# Patient Record
Sex: Male | Born: 1995 | Race: Asian | Hispanic: No | Marital: Single | Smoking: Never smoker
Health system: Southern US, Community
[De-identification: ages and names within clinical notes are randomized; demographics above are authoritative.]

## PROBLEM LIST (undated history)

## (undated) DIAGNOSIS — U071 COVID-19: Secondary | ICD-10-CM

---

## 2019-08-12 ENCOUNTER — Emergency Department (HOSPITAL_COMMUNITY): Payer: Managed Care, Other (non HMO)

## 2019-08-12 ENCOUNTER — Encounter (HOSPITAL_COMMUNITY): Payer: Self-pay

## 2019-08-12 ENCOUNTER — Inpatient Hospital Stay (HOSPITAL_COMMUNITY)
Admission: EM | Admit: 2019-08-12 | Discharge: 2019-08-14 | DRG: 084 | Disposition: A | Discharge status: Home / Self Care | Payer: Managed Care, Other (non HMO) | Attending: Internal Medicine | Admitting: Internal Medicine

## 2019-08-12 ENCOUNTER — Other Ambulatory Visit: Payer: Self-pay

## 2019-08-12 DIAGNOSIS — E876 Hypokalemia: Secondary | ICD-10-CM | POA: Diagnosis present

## 2019-08-12 DIAGNOSIS — S066X9A Traumatic subarachnoid hemorrhage with loss of consciousness of unspecified duration, initial encounter: Principal | ICD-10-CM | POA: Diagnosis present

## 2019-08-12 DIAGNOSIS — R402143 Coma scale, eyes open, spontaneous, at hospital admission: Secondary | ICD-10-CM | POA: Diagnosis present

## 2019-08-12 DIAGNOSIS — R402253 Coma scale, best verbal response, oriented, at hospital admission: Secondary | ICD-10-CM | POA: Diagnosis present

## 2019-08-12 DIAGNOSIS — I629 Nontraumatic intracranial hemorrhage, unspecified: Secondary | ICD-10-CM

## 2019-08-12 DIAGNOSIS — S060XAA Concussion with loss of consciousness status unknown, initial encounter: Secondary | ICD-10-CM

## 2019-08-12 DIAGNOSIS — Z8616 Personal history of COVID-19: Secondary | ICD-10-CM

## 2019-08-12 DIAGNOSIS — R402363 Coma scale, best motor response, obeys commands, at hospital admission: Secondary | ICD-10-CM | POA: Diagnosis present

## 2019-08-12 DIAGNOSIS — S0990XA Unspecified injury of head, initial encounter: Secondary | ICD-10-CM

## 2019-08-12 DIAGNOSIS — R569 Unspecified convulsions: Secondary | ICD-10-CM | POA: Diagnosis present

## 2019-08-12 DIAGNOSIS — S0003XD Contusion of scalp, subsequent encounter: Secondary | ICD-10-CM | POA: Diagnosis not present

## 2019-08-12 DIAGNOSIS — W19XXXD Unspecified fall, subsequent encounter: Secondary | ICD-10-CM | POA: Diagnosis not present

## 2019-08-12 DIAGNOSIS — S0003XA Contusion of scalp, initial encounter: Secondary | ICD-10-CM | POA: Diagnosis present

## 2019-08-12 DIAGNOSIS — Y92008 Other place in unspecified non-institutional (private) residence as the place of occurrence of the external cause: Secondary | ICD-10-CM

## 2019-08-12 DIAGNOSIS — W19XXXA Unspecified fall, initial encounter: Secondary | ICD-10-CM | POA: Diagnosis not present

## 2019-08-12 DIAGNOSIS — R55 Syncope and collapse: Secondary | ICD-10-CM | POA: Diagnosis present

## 2019-08-12 DIAGNOSIS — S060X9A Concussion with loss of consciousness of unspecified duration, initial encounter: Secondary | ICD-10-CM

## 2019-08-12 HISTORY — DX: COVID-19: U07.1

## 2019-08-12 LAB — COMPREHENSIVE METABOLIC PANEL
ALT: 21 U/L (ref 0–44)
AST: 21 U/L (ref 15–41)
Albumin: 4.5 g/dL (ref 3.5–5.0)
Alkaline Phosphatase: 43 U/L (ref 38–126)
Anion gap: 14 (ref 5–15)
BUN: 16 mg/dL (ref 6–20)
CO2: 22 mmol/L (ref 22–32)
Calcium: 9.6 mg/dL (ref 8.9–10.3)
Chloride: 102 mmol/L (ref 98–111)
Creatinine, Ser: 0.84 mg/dL (ref 0.61–1.24)
GFR calc Af Amer: >60 mL/min (ref >60)
GFR calc non Af Amer: >60 mL/min (ref >60)
Glucose, Bld: 122 mg/dL — ABNORMAL HIGH (ref 70–99)
Potassium: 3.7 mmol/L (ref 3.5–5.1)
Sodium: 138 mmol/L (ref 135–145)
Total Bilirubin: 0.8 mg/dL (ref 0.3–1.2)
Total Protein: 6.6 g/dL (ref 6.5–8.1)

## 2019-08-12 LAB — CBC WITH DIFFERENTIAL/PLATELET
Abs Immature Granulocytes: 0.06 10*3/uL (ref 0.00–0.07)
Basophils Absolute: 0 10*3/uL (ref 0.0–0.1)
Basophils Relative: 0 %
Eosinophils Absolute: 0.1 10*3/uL (ref 0.0–0.5)
Eosinophils Relative: 1 %
HCT: 46.2 % (ref 39.0–52.0)
Hemoglobin: 16.2 g/dL (ref 13.0–17.0)
Immature Granulocytes: 1 %
Lymphocytes Relative: 18 %
Lymphs Abs: 1.7 10*3/uL (ref 0.7–4.0)
MCH: 32.5 pg (ref 26.0–34.0)
MCHC: 35.1 g/dL (ref 30.0–36.0)
MCV: 92.6 fL (ref 80.0–100.0)
Monocytes Absolute: 0.4 10*3/uL (ref 0.1–1.0)
Monocytes Relative: 4 %
Neutro Abs: 6.8 10*3/uL (ref 1.7–7.7)
Neutrophils Relative %: 76 %
Platelets: 197 10*3/uL (ref 150–400)
RBC: 4.99 MIL/uL (ref 4.22–5.81)
RDW: 11.6 % (ref 11.5–15.5)
WBC: 9 10*3/uL (ref 4.0–10.5)
nRBC: 0 % (ref 0.0–0.2)

## 2019-08-12 LAB — URINALYSIS, ROUTINE W REFLEX MICROSCOPIC
Bilirubin Urine: NEGATIVE
Glucose, UA: NEGATIVE mg/dL
Hgb urine dipstick: NEGATIVE
Ketones, ur: 5 mg/dL — AB
Leukocytes,Ua: NEGATIVE
Nitrite: NEGATIVE
Protein, ur: NEGATIVE mg/dL
Specific Gravity, Urine: 1.012 (ref 1.005–1.030)
pH: 7 (ref 5.0–8.0)

## 2019-08-12 LAB — CBG MONITORING, ED: Glucose-Capillary: 118 mg/dL — ABNORMAL HIGH (ref 70–99)

## 2019-08-12 LAB — RAPID URINE DRUG SCREEN, HOSP PERFORMED
Amphetamines: NOT DETECTED
Barbiturates: NOT DETECTED
Benzodiazepines: NOT DETECTED
Cocaine: NOT DETECTED
Opiates: NOT DETECTED
Tetrahydrocannabinol: NOT DETECTED

## 2019-08-12 LAB — TROPONIN I (HIGH SENSITIVITY)
Troponin I (High Sensitivity): <2 ng/L (ref <18)
Troponin I (High Sensitivity): 4 ng/L (ref <18)

## 2019-08-12 LAB — ETHANOL: Alcohol, Ethyl (B): <10 mg/dL (ref <10)

## 2019-08-12 MED ORDER — ACETAMINOPHEN 650 MG RE SUPP: 650.0000 mg | Freq: Four times a day (QID) | RECTAL | Status: DC | PRN

## 2019-08-12 MED ORDER — SODIUM CHLORIDE 0.9 % IV SOLN: INTRAVENOUS | Status: DC

## 2019-08-12 MED ORDER — ACETAMINOPHEN 325 MG PO TABS
650.0000 mg | ORAL_TABLET | Freq: Four times a day (QID) | ORAL | Status: DC | PRN
  Filled 2019-08-12: qty 2

## 2019-08-12 MED ORDER — ONDANSETRON HCL 4 MG PO TABS: 4.0000 mg | ORAL_TABLET | Freq: Four times a day (QID) | ORAL | Status: DC | PRN

## 2019-08-12 MED ORDER — ACETAMINOPHEN 325 MG PO TABS
650.0000 mg | ORAL_TABLET | Freq: Once | ORAL | Status: AC
  Administered 2019-08-12: 17:00:00 650 mg via ORAL
  Filled 2019-08-12: qty 2

## 2019-08-12 MED ORDER — SODIUM CHLORIDE 0.9 % IV BOLUS
1000.0000 mL | Freq: Once | INTRAVENOUS | Status: AC
  Administered 2019-08-12: 1000 mL via INTRAVENOUS

## 2019-08-12 MED ORDER — ONDANSETRON HCL 4 MG/2ML IJ SOLN: 4.0000 mg | Freq: Four times a day (QID) | INTRAMUSCULAR | Status: DC | PRN

## 2019-08-12 MED ORDER — ONDANSETRON HCL 4 MG/2ML IJ SOLN: 4.0000 mg | Freq: Once | INTRAMUSCULAR | Status: DC

## 2019-08-12 NOTE — ED Notes (Signed)
Pt vomited in room. Pt A&Ox4 at this time.

## 2019-08-12 NOTE — ED Notes (Signed)
Patient transported to CT 

## 2019-08-12 NOTE — ED Notes (Signed)
Pt felt he could not urinate and requested more PO fluids to urinate.

## 2019-08-12 NOTE — H&P (Signed)
History and body  Arthur Flores IRS:[id number] DOB: 29-May-1996 DOA: 08/12/2019  Referring MD/NP/PA: Dr. Darl Householder  PCP: Patient, No Pcp Per   Outpatient Specialists: None  Patient coming from: Home  Chief Complaint: Passing out  HPI: Arthur Flores is a 24 y.o. 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  Patient has no recollection of what happened and how long has been down.  Family found him and brought him to the ER.  He was found to have significant hematoma on his skull and CT findings of possible head bleed.  Patient is currently back to baseline.  He reported occasional dizziness during his exercises in the past.  He is always told that was normal.  He has had palpitations during exercise which he also thought was normal.  Patient uses some herbal supplements which are plant-based but no steroids.  Denies using any drugs.  Patient has had previous COVID-19 infection about 2 months ago so outside the window for infectiousness..  ED Course: Temperature 98.5 blood pressure 140/88 pulse 80 respiratory rate of 22 oxygen sat 95% room air.  CBC and chemistry all appears to be within normal except potassium 3.4.  Urine drug screen is negative chest x-ray shows no acute finding.  CT head showed a 3 mm hyperdense focus in the right parietal lobe.  Also right parietal scalp hematoma.  Suspected punctate focus of subarachnoid blood versus small contusion.  Cervical spine is straight and otherwise no acute findings  Review of Systems: As per HPI otherwise 10 point review of systems negative.    Past Medical History:  Diagnosis Date  . COVID-19 virus infection     History reviewed. No pertinent surgical history.   reports that he has never smoked. He has never used smokeless tobacco. He reports that he does not  drink alcohol or use drugs.  Allergies  Allergen Reactions  . Dairy Aid [Lactase] Other (See Comments)    Upset stomach    History reviewed. No pertinent family history.   Prior to Admission medications   Not on File    Physical Exam: Vitals:   08/12/19 1830 08/12/19 1845 08/12/19 1900 08/12/19 1915  BP: 118/68 117/66 115/67 133/85  Pulse: 71 69 72 76  Resp: 18 15 (!) 22 (!) 21  Temp:      TempSrc:      SpO2: 100% 98% 98% 99%  Weight:      Height:          Constitutional: NAD, anxious, right parietal area hematoma of the skull Vitals:   08/12/19 1830 08/12/19 1845 08/12/19 1900 08/12/19 1915  BP: 118/68 117/66 115/67 133/85  Pulse: 71 69 72 76  Resp: 18 15 (!) 22 (!) 21  Temp:      TempSrc:      SpO2: 100% 98% 98% 99%  Weight:      Height:       Eyes: PERRL, lids and conjunctivae normal ENMT: Mucous membranes are moist. Posterior pharynx clear of any exudate or lesions.Normal dentition.  Neck: normal, supple, no masses, no thyromegaly Respiratory: clear to auscultation bilaterally, no wheezing, no crackles. Normal respiratory effort. No accessory muscle use.  Cardiovascular: Regular rate and rhythm, no murmurs / rubs / gallops. No extremity edema. 2+ pedal pulses. No carotid bruits.  Abdomen: no tenderness, no masses  palpated. No hepatosplenomegaly. Bowel sounds positive.  Musculoskeletal: no clubbing / cyanosis. No joint deformity upper and lower extremities. Good ROM, no contractures. Normal muscle tone.  Skin: no rashes, lesions, ulcers. No induration Neurologic: CN 2-12 grossly intact. Sensation intact, DTR normal. Strength 5/5 in all 4.  Psychiatric: Normal judgment and insight. Alert and oriented x 3. Normal mood.     Labs on Admission: I have personally reviewed following labs and imaging studies  CBC: Recent Labs  Lab 08/12/19 1641  WBC 9.0  NEUTROABS 6.8  HGB 16.2  HCT 46.2  MCV 92.6  PLT 197   Basic Metabolic Panel: Recent Labs  Lab  08/12/19 1641  NA 138  K 3.7  CL 102  CO2 22  GLUCOSE 122*  BUN 16  CREATININE 0.84  CALCIUM 9.6   GFR: Estimated Creatinine Clearance: 164.9 mL/min (by C-G formula based on SCr of 0.84 mg/dL). Liver Function Tests: Recent Labs  Lab 08/12/19 1641  AST 21  ALT 21  ALKPHOS 43  BILITOT 0.8  PROT 6.6  ALBUMIN 4.5   No results for input(s): LIPASE, AMYLASE in the last 168 hours. No results for input(s): AMMONIA in the last 168 hours. Coagulation Profile: No results for input(s): INR, PROTIME in the last 168 hours. Cardiac Enzymes: No results for input(s): CKTOTAL, CKMB, CKMBINDEX, TROPONINI in the last 168 hours. BNP (last 3 results) No results for input(s): PROBNP in the last 8760 hours. HbA1C: No results for input(s): HGBA1C in the last 72 hours. CBG: Recent Labs  Lab 08/12/19 1704  GLUCAP 118*   Lipid Profile: No results for input(s): CHOL, HDL, LDLCALC, TRIG, CHOLHDL, LDLDIRECT in the last 72 hours. Thyroid Function Tests: No results for input(s): TSH, T4TOTAL, FREET4, T3FREE, THYROIDAB in the last 72 hours. Anemia Panel: No results for input(s): VITAMINB12, FOLATE, FERRITIN, TIBC, IRON, RETICCTPCT in the last 72 hours. Urine analysis: No results found for: COLORURINE, APPEARANCEUR, LABSPEC, PHURINE, GLUCOSEU, HGBUR, BILIRUBINUR, KETONESUR, PROTEINUR, UROBILINOGEN, NITRITE, LEUKOCYTESUR Sepsis Labs: @LABRCNTIP (procalcitonin:4,lacticidven:4) )No results found for this or any previous visit (from the past 240 hour(s)).   Radiological Exams on Admission: DG Chest 2 View  Result Date: 08/12/2019 CLINICAL DATA:  Syncope, unwitnessed fall. EXAM: CHEST - 2 VIEW COMPARISON:  None. FINDINGS: The heart size and mediastinal contours are within normal limits. Both lungs are clear. No pneumothorax or pleural effusion is noted. The visualized skeletal structures are unremarkable. IMPRESSION: No active cardiopulmonary disease. Electronically Signed   By: 10/12/2019 M.D.    On: 08/12/2019 17:09   CT HEAD WO CONTRAST  Result Date: 08/12/2019 CLINICAL DATA:  Syncope EXAM: CT HEAD WITHOUT CONTRAST CT CERVICAL SPINE WITHOUT CONTRAST TECHNIQUE: Multidetector CT imaging of the head and cervical spine was performed following the standard protocol without intravenous contrast. Multiplanar CT image reconstructions of the cervical spine were also generated. COMPARISON:  None. FINDINGS: CT HEAD FINDINGS Brain: No acute territorial infarction. Small 3 mm hyperdense focus at the right parietal lobe without surrounding mass effect or significant edema. The ventricles are nonenlarged. Vascular: No hyperdense vessels.  No unexpected calcification Skull: None Sinuses/Orbits: Moderate right parietal scalp hematoma. Other: None CT CERVICAL SPINE FINDINGS Alignment: Straightening of the cervical spine. No subluxation. Facet alignment is normal Skull base and vertebrae: No acute fracture. No primary bone lesion or focal pathologic process. Soft tissues and spinal canal: No prevertebral fluid or swelling. No visible canal hematoma. Disc levels:  Within normal limits Upper chest: Negative. Other: None IMPRESSION: 1. 3 mm hyperdense  focus at the right parietal lobe. Given overlying right parietal scalp hematoma, favor punctate focus of subarachnoid blood versus small contusion. Hyperdense nodule felt less likely in the absence of any known clinical history of malignancy. 2. Straightening of the cervical spine. No acute osseous abnormality. 3. Critical Value/emergent results were called by telephone at the time of interpretation on 08/12/2019 at 6:12 pm to provider [facility] , who verbally acknowledged these results. Electronically Signed   By: Jasmine Pang M.D.   On: 08/12/2019 18:13   CT CERVICAL SPINE WO CONTRAST  Result Date: 08/12/2019 CLINICAL DATA:  Syncope EXAM: CT HEAD WITHOUT CONTRAST CT CERVICAL SPINE WITHOUT CONTRAST TECHNIQUE: Multidetector CT imaging of the head and cervical spine was  performed following the standard protocol without intravenous contrast. Multiplanar CT image reconstructions of the cervical spine were also generated. COMPARISON:  None. FINDINGS: CT HEAD FINDINGS Brain: No acute territorial infarction. Small 3 mm hyperdense focus at the right parietal lobe without surrounding mass effect or significant edema. The ventricles are nonenlarged. Vascular: No hyperdense vessels.  No unexpected calcification Skull: None Sinuses/Orbits: Moderate right parietal scalp hematoma. Other: None CT CERVICAL SPINE FINDINGS Alignment: Straightening of the cervical spine. No subluxation. Facet alignment is normal Skull base and vertebrae: No acute fracture. No primary bone lesion or focal pathologic process. Soft tissues and spinal canal: No prevertebral fluid or swelling. No visible canal hematoma. Disc levels:  Within normal limits Upper chest: Negative. Other: None IMPRESSION: 1. 3 mm hyperdense focus at the right parietal lobe. Given overlying right parietal scalp hematoma, favor punctate focus of subarachnoid blood versus small contusion. Hyperdense nodule felt less likely in the absence of any known clinical history of malignancy. 2. Straightening of the cervical spine. No acute osseous abnormality. 3. Critical Value/emergent results were called by telephone at the time of interpretation on 08/12/2019 at 6:12 pm to provider [facility] , who verbally acknowledged these results. Electronically Signed   By: Jasmine Pang M.D.   On: 08/12/2019 18:13    EKG: Independently reviewed.  Normal sinus rhythm, mild ST elevation with peaked T waves in the lateral leads, no old 1 to compare also right axis deviation right.  Assessment/Plan Active Problems:   Fall     #1 syncope versus seizure: Patient with passing out could be secondary to cardiogenic causes.  He reported occasional dizziness in the past.  He is an extensive exercise person.  He also takes some herbal medicine but not sure if it has  any side effects.  EKG is also not entirely normal.  I will order echocardiogram.  Fluid resuscitation.  He may require EP studies.  Note seizure treatment at this point as this is not observed and this is first episode.  #2 scalp hematoma: Mainly supportive care.  #3 hypokalemia: Replete potassium  #4 possible subarachnoid hemorrhage: Neurosurgery consulted by ER.  Reportedly did not think this is intracranial hemorrhage.  I will repeat head CT in the morning to make sure is not expanding.    DVT prophylaxis: SCD Code Status: Full code Family Communication: Patient's parents Disposition Plan: Home Consults called: None Admission status: Observation  Severity of Illness: The appropriate patient status for this patient is OBSERVATION. Observation status is judged to be reasonable and necessary in order to provide the required intensity of service to ensure the patient's safety. The patient's presenting symptoms, physical exam findings, and initial radiographic and laboratory data in the context of their medical condition is felt to place them at  decreased risk for further clinical deterioration. Furthermore, it is anticipated that the patient will be medically stable for discharge from the hospital within 2 midnights of admission. The following factors support the patient status of observation.   " The patient's presenting symptoms include passing out. " The physical exam findings include no significant finding on exam. " The initial radiographic and laboratory data are potassium 3.4 and possible scalp hematoma.     Lonia Blood MD [facility] Pager [phone]  If 7PM-7AM, please contact night-coverage [email or web address] Password [email or web address]  08/12/2019, 8:25 PM

## 2019-08-12 NOTE — ED Provider Notes (Addendum)
[facility] EMERGENCY DEPARTMENT Provider Note   CSN: [id number] Arrival date & time: 08/12/19  1614     History Chief Complaint  Patient presents with  . Fall    Arthur Flores is a 24 y.o. male.  HPI      Arthur Flores is a 24 y.o. male, with a history of COVID-19 infection January 2021, presenting to the ED with possible syncopal episode that occurred shortly prior to arrival. Patient was found unconscious in the garage around 3 PM today.  His family had left around 1 PM and returned to find him unconscious.  They were able to wake him a little, but he seemed groggy.  EMS reports patient was still quite confused upon their arrival, but was responsive.  They noted confusion as to the event, date, and location as well as repetitive questioning for the duration of their ride to the hospital.  No noted seizure activity. Patient states he has not had a syncopal episode before.  No noted history of seizures. He went out to the garage to work out and he thinks he was working on calisthenics. Currently, patient's only complaint is pain to the back of his head and a headache, throbbing, moderate, nonradiating from the head. Denies anticoagulation.  Denies alcohol or illicit drug use.  Other than a protein supplement, denies supplement use. He has had occasional episodes of dizziness upon standing over the last several years, but nothing consistent.  Denies recent illness, fever, neck/back pain, chest pain, shortness of breath, cough, abdominal pain, vomiting, diarrhea, incontinence, or any other complaints.       Past Medical History:  Diagnosis Date  . COVID-19 virus infection     Patient Active Problem List   Diagnosis Date Noted  . Fall 08/12/2019    History reviewed. No pertinent surgical history.     History reviewed. No pertinent family history.  Social History   Tobacco Use  . Smoking status: Never Smoker  . Smokeless tobacco: Never Used    Substance Use Topics  . Alcohol use: Never  . Drug use: Never    Home Medications Prior to Admission medications   Not on File    Allergies    Dairy aid [lactase]  Review of Systems   Review of Systems  Constitutional: Negative for chills, diaphoresis and fever.  Eyes: Negative for visual disturbance.  Respiratory: Negative for cough and shortness of breath.   Cardiovascular: Negative for chest pain, palpitations and leg swelling.  Gastrointestinal: Negative for abdominal pain, diarrhea, nausea and vomiting.  Musculoskeletal: Negative for back pain and neck pain.  Neurological: Positive for syncope and headaches. Negative for dizziness, weakness, light-headedness and numbness.  All other systems reviewed and are negative.   Physical Exam Updated Vital Signs BP 140/88   Pulse 68   Temp 98.3 F (36.8 C) (Oral)   Resp 16   Ht 6\' 3"  (1.905 m)   Wt 88.5 kg   SpO2 100%   BMI 24.37 kg/m   Physical Exam Vitals and nursing note reviewed.  Constitutional:      General: He is not in acute distress.    Appearance: He is well-developed. He is not diaphoretic.     Interventions: Cervical collar in place.  HENT:     Head: Normocephalic.     Comments: Area of tenderness and swelling to the right occipital scalp.  No noted wounds or instability. No tenderness, deformity, instability, or other signs of injury to the rest  of the scalp or face.    Mouth/Throat:     Mouth: Mucous membranes are moist.     Pharynx: Oropharynx is clear.  Eyes:     Extraocular Movements: Extraocular movements intact.     Conjunctiva/sclera: Conjunctivae normal.     Pupils: Pupils are equal, round, and reactive to light.  Cardiovascular:     Rate and Rhythm: Normal rate and regular rhythm.     Pulses: Normal pulses.          Radial pulses are 2+ on the right side and 2+ on the left side.       Posterior tibial pulses are 2+ on the right side and 2+ on the left side.     Heart sounds: Normal heart  sounds.     Comments: Tactile temperature in the extremities appropriate and equal bilaterally. Pulmonary:     Effort: Pulmonary effort is normal. No respiratory distress.     Breath sounds: Normal breath sounds.  Abdominal:     Palpations: Abdomen is soft.     Tenderness: There is no abdominal tenderness. There is no guarding.  Musculoskeletal:     Cervical back: Neck supple.     Right lower leg: No edema.     Left lower leg: No edema.  Lymphadenopathy:     Cervical: No cervical adenopathy.  Skin:    General: Skin is warm and dry.  Neurological:     Mental Status: He is alert and oriented to person, place, and time.     Comments: Alert and fully oriented upon my evaluation. No noted acute cognitive deficit. Sensation grossly intact to light touch in the extremities.   Grip strengths equal bilaterally.   Strength 5/5 in all extremities.  Coordination intact.  Cranial nerves III-XII grossly intact.  Handles oral secretions without noted difficulty.  No noted phonation or speech deficit. No facial droop.   Psychiatric:        Mood and Affect: Mood and affect normal.        Speech: Speech normal.        Behavior: Behavior normal.     ED Results / Procedures / Treatments   Labs (all labs ordered are listed, but only abnormal results are displayed) Labs Reviewed  COMPREHENSIVE METABOLIC PANEL - Abnormal; Notable for the following components:      Result Value   Glucose, Bld 122 (*)    All other components within normal limits  URINALYSIS, ROUTINE W REFLEX MICROSCOPIC - Abnormal; Notable for the following components:   Ketones, ur 5 (*)    All other components within normal limits  CBG MONITORING, ED - Abnormal; Notable for the following components:   Glucose-Capillary 118 (*)    All other components within normal limits  CBC WITH DIFFERENTIAL/PLATELET  RAPID URINE DRUG SCREEN, HOSP PERFORMED  ETHANOL  HIV ANTIBODY (ROUTINE TESTING W REFLEX)  COMPREHENSIVE METABOLIC  PANEL  CBC  TROPONIN I (HIGH SENSITIVITY)  TROPONIN I (HIGH SENSITIVITY)    EKG EKG Interpretation  Date/Time:  Tuesday August 12 2019 16:49:19 EST Ventricular Rate:  64 PR Interval:    QRS Duration: 100 QT Interval:  399 QTC Calculation: 412 R Axis:   98 Text Interpretation: Sinus rhythm Borderline right axis deviation RSR' in V1 or V2, probably normal variant ST elevation suggests acute pericarditis no wpw, prolonged qt or brugada No old tracing to compare Confirmed by Melene Plan [phone]) on 08/12/2019 5:39:03 PM   Radiology DG Chest 2 View  Result Date:  08/12/2019 CLINICAL DATA:  Syncope, unwitnessed fall. EXAM: CHEST - 2 VIEW COMPARISON:  None. FINDINGS: The heart size and mediastinal contours are within normal limits. Both lungs are clear. No pneumothorax or pleural effusion is noted. The visualized skeletal structures are unremarkable. IMPRESSION: No active cardiopulmonary disease. Electronically Signed   By: Lupita Raider M.D.   On: 08/12/2019 17:09   CT HEAD WO CONTRAST  Result Date: 08/12/2019 CLINICAL DATA:  Syncope EXAM: CT HEAD WITHOUT CONTRAST CT CERVICAL SPINE WITHOUT CONTRAST TECHNIQUE: Multidetector CT imaging of the head and cervical spine was performed following the standard protocol without intravenous contrast. Multiplanar CT image reconstructions of the cervical spine were also generated. COMPARISON:  None. FINDINGS: CT HEAD FINDINGS Brain: No acute territorial infarction. Small 3 mm hyperdense focus at the right parietal lobe without surrounding mass effect or significant edema. The ventricles are nonenlarged. Vascular: No hyperdense vessels.  No unexpected calcification Skull: None Sinuses/Orbits: Moderate right parietal scalp hematoma. Other: None CT CERVICAL SPINE FINDINGS Alignment: Straightening of the cervical spine. No subluxation. Facet alignment is normal Skull base and vertebrae: No acute fracture. No primary bone lesion or focal pathologic process. Soft tissues  and spinal canal: No prevertebral fluid or swelling. No visible canal hematoma. Disc levels:  Within normal limits Upper chest: Negative. Other: None IMPRESSION: 1. 3 mm hyperdense focus at the right parietal lobe. Given overlying right parietal scalp hematoma, favor punctate focus of subarachnoid blood versus small contusion. Hyperdense nodule felt less likely in the absence of any known clinical history of malignancy. 2. Straightening of the cervical spine. No acute osseous abnormality. 3. Critical Value/emergent results were called by telephone at the time of interpretation on 08/12/2019 at 6:12 pm to provider [facility] , who verbally acknowledged these results. Electronically Signed   By: Jasmine Pang M.D.   On: 08/12/2019 18:13   CT CERVICAL SPINE WO CONTRAST  Result Date: 08/12/2019 CLINICAL DATA:  Syncope EXAM: CT HEAD WITHOUT CONTRAST CT CERVICAL SPINE WITHOUT CONTRAST TECHNIQUE: Multidetector CT imaging of the head and cervical spine was performed following the standard protocol without intravenous contrast. Multiplanar CT image reconstructions of the cervical spine were also generated. COMPARISON:  None. FINDINGS: CT HEAD FINDINGS Brain: No acute territorial infarction. Small 3 mm hyperdense focus at the right parietal lobe without surrounding mass effect or significant edema. The ventricles are nonenlarged. Vascular: No hyperdense vessels.  No unexpected calcification Skull: None Sinuses/Orbits: Moderate right parietal scalp hematoma. Other: None CT CERVICAL SPINE FINDINGS Alignment: Straightening of the cervical spine. No subluxation. Facet alignment is normal Skull base and vertebrae: No acute fracture. No primary bone lesion or focal pathologic process. Soft tissues and spinal canal: No prevertebral fluid or swelling. No visible canal hematoma. Disc levels:  Within normal limits Upper chest: Negative. Other: None IMPRESSION: 1. 3 mm hyperdense focus at the right parietal lobe. Given overlying right  parietal scalp hematoma, favor punctate focus of subarachnoid blood versus small contusion. Hyperdense nodule felt less likely in the absence of any known clinical history of malignancy. 2. Straightening of the cervical spine. No acute osseous abnormality. 3. Critical Value/emergent results were called by telephone at the time of interpretation on 08/12/2019 at 6:12 pm to provider [facility] , who verbally acknowledged these results. Electronically Signed   By: Jasmine Pang M.D.   On: 08/12/2019 18:13    Procedures .Critical Care Performed by: Anselm Pancoast, PA-C Authorized by: Anselm Pancoast, PA-C   Critical care provider statement:  Critical care time (minutes):  35   Critical care time was exclusive of:  Separately billable procedures and treating other patients   Critical care was necessary to treat or prevent imminent or life-threatening deterioration of the following conditions:  Trauma   Critical care was time spent personally by me on the following activities:  Development of treatment plan with patient or surrogate, discussions with consultants, obtaining history from patient or surrogate, examination of patient, evaluation of patient's response to treatment, ordering and performing treatments and interventions, ordering and review of laboratory studies, ordering and review of radiographic studies, pulse oximetry and re-evaluation of patient's condition   I assumed direction of critical care for this patient from another provider in my specialty: no     (including critical care time)  Medications Ordered in ED Medications  sodium chloride 0.9 % bolus 1,000 mL (0 mLs Intravenous Stopped 08/12/19 1939)    And  0.9 %  sodium chloride infusion ( Intravenous Stopped 08/12/19 2038)  ondansetron (ZOFRAN) injection 4 mg (4 mg Intravenous Not Given 08/12/19 2030)  0.9 %  sodium chloride infusion ( Intravenous New Bag/Given 08/12/19 2039)  ondansetron (ZOFRAN) tablet 4 mg (has no administration in time  range)    Or  ondansetron (ZOFRAN) injection 4 mg (has no administration in time range)  acetaminophen (TYLENOL) tablet 650 mg (has no administration in time range)    Or  acetaminophen (TYLENOL) suppository 650 mg (has no administration in time range)  acetaminophen (TYLENOL) tablet 650 mg (650 mg Oral Given 08/12/19 1701)    ED Course  I have reviewed the triage vital signs and the nursing notes.  Pertinent labs & imaging results that were available during my care of the patient were reviewed by me and considered in my medical decision making (see chart for details).  Clinical Course as of Aug 11 2137  Tue Aug 12, 2019  1810 RN notes patient vomited in the room.  I reexamined patient.  He has no onset of neurologic deficits.  No recurrence of confusion.  States he feels much better after his single episode of vomiting with nausea improving.   [SJ]  [id number] Spoke with Dr. Danielle Dess, neurosurgeon. He states this area of blood on the CT is a subcortical area of blood consistent with bruising from the bone.  This is not an unexpected finding given that it is likely that the patient hit his head.  There is no CTA necessary.  If he was not otherwise being admitted, he could be discharged with close observation by family. He does not need repeat CT scan unless he has a change in his mental status or neurologic exam. No further neurosurgical involvement is necessary.   [SJ]  [id number] Spoke with Dr. Mikeal Hawthorne, hospitalist.  Agrees to admit the patient.   [SJ]    Clinical Course User Index [SJ] Cledis Sohn, Hillard Danker, PA-C   MDM Rules/Calculators/A&P                      Patient presents following episode of possible syncope with subsequent head injury.  The concern is that his syncope may have been exertional while he was exercising. He had a period of confusion that seemed to steadily improve.  Once he was alert and oriented, we did not see any decline here in the ED. Vital signs and lab results reassuring. EKG  findings could be due to the patient's body type (not overweight, muscular). Scalp hematoma with underlying subcortical bleeding on  CT. Patient admitted for further work-up.   Findings and plan of care discussed with Deno Etienne, DO. Dr. Tyrone Nine personally evaluated and examined this patient.   Vitals:   08/12/19 1900 08/12/19 1915 08/12/19 2000 08/12/19 2030  BP: 115/67 133/85 126/85 (!) 138/93  Pulse: 72 76 79 78  Resp: (!) 22 (!) 21 17 18   Temp:      TempSrc:      SpO2: 98% 99% 100% 100%  Weight:      Height:          Final Clinical Impression(s) / ED Diagnoses Final diagnoses:  Syncope and collapse  Injury of head, initial encounter    Rx / DC Orders ED Discharge Orders    None       Layla Maw 08/12/19 2140    Deno Etienne, DO 08/12/19 2317  Added Critical Care Time   Layla Maw 08/12/19 [place], [place], DO 08/13/19 1511

## 2019-08-12 NOTE — ED Triage Notes (Signed)
Pt BIB [facility] for eval of unwitnessed fall. EMS reports that father found down in garage. Pt reports head/neck. Pt amnesic to events. Pt reports that he went outside to do some exercising and that was the last thing he remembered. Pt is slowly coming around and recalling surrounding events.

## 2019-08-13 ENCOUNTER — Inpatient Hospital Stay (HOSPITAL_COMMUNITY): Payer: Managed Care, Other (non HMO)

## 2019-08-13 DIAGNOSIS — W19XXXD Unspecified fall, subsequent encounter: Secondary | ICD-10-CM

## 2019-08-13 DIAGNOSIS — R55 Syncope and collapse: Secondary | ICD-10-CM

## 2019-08-13 DIAGNOSIS — E876 Hypokalemia: Secondary | ICD-10-CM

## 2019-08-13 DIAGNOSIS — S0003XA Contusion of scalp, initial encounter: Secondary | ICD-10-CM | POA: Diagnosis present

## 2019-08-13 DIAGNOSIS — S0003XD Contusion of scalp, subsequent encounter: Secondary | ICD-10-CM

## 2019-08-13 LAB — ECHOCARDIOGRAM COMPLETE
Height: 75 in
Weight: 3120 oz

## 2019-08-13 LAB — COMPREHENSIVE METABOLIC PANEL
ALT: 18 U/L (ref 0–44)
AST: 18 U/L (ref 15–41)
Albumin: 4.1 g/dL (ref 3.5–5.0)
Alkaline Phosphatase: 44 U/L (ref 38–126)
Anion gap: 10 (ref 5–15)
BUN: 9 mg/dL (ref 6–20)
CO2: 25 mmol/L (ref 22–32)
Calcium: 9.4 mg/dL (ref 8.9–10.3)
Chloride: 103 mmol/L (ref 98–111)
Creatinine, Ser: 0.83 mg/dL (ref 0.61–1.24)
GFR calc Af Amer: >60 mL/min (ref >60)
GFR calc non Af Amer: >60 mL/min (ref >60)
Glucose, Bld: 136 mg/dL — ABNORMAL HIGH (ref 70–99)
Potassium: 3.4 mmol/L — ABNORMAL LOW (ref 3.5–5.1)
Sodium: 138 mmol/L (ref 135–145)
Total Bilirubin: 1.2 mg/dL (ref 0.3–1.2)
Total Protein: 6.5 g/dL (ref 6.5–8.1)

## 2019-08-13 LAB — SARS CORONAVIRUS 2 (TAT 6-24 HRS): SARS Coronavirus 2: NEGATIVE

## 2019-08-13 LAB — PHOSPHORUS: Phosphorus: 3.6 mg/dL (ref 2.5–4.6)

## 2019-08-13 LAB — CBC
HCT: 43.3 % (ref 39.0–52.0)
Hemoglobin: 15 g/dL (ref 13.0–17.0)
MCH: 32.1 pg (ref 26.0–34.0)
MCHC: 34.6 g/dL (ref 30.0–36.0)
MCV: 92.7 fL (ref 80.0–100.0)
Platelets: 194 10*3/uL (ref 150–400)
RBC: 4.67 MIL/uL (ref 4.22–5.81)
RDW: 11.8 % (ref 11.5–15.5)
WBC: 9.8 10*3/uL (ref 4.0–10.5)
nRBC: 0 % (ref 0.0–0.2)

## 2019-08-13 LAB — MAGNESIUM: Magnesium: 2 mg/dL (ref 1.7–2.4)

## 2019-08-13 LAB — HIV ANTIBODY (ROUTINE TESTING W REFLEX): HIV Screen 4th Generation wRfx: NONREACTIVE

## 2019-08-13 MED ORDER — POTASSIUM CHLORIDE CRYS ER 20 MEQ PO TBCR
40.0000 meq | EXTENDED_RELEASE_TABLET | Freq: Once | ORAL | Status: AC
  Administered 2019-08-13: 40 meq via ORAL
  Filled 2019-08-13: qty 2

## 2019-08-13 NOTE — Progress Notes (Addendum)
Progress Note    Arthur Flores  WHQ:[id number] DOB: 05-15-1996  DOA: 08/12/2019 PCP: Patient, No Pcp Per      Brief Narrative:    Medical records reviewed and are as summarized below:  Arthur Flores is an 24 y.o. male without any significant past medical history who presented to the hospital with a syncopal episode that occurred while he was exercising. He said he hit his head when he passed out. Patient had no recollection of what happened and how long he had been down.  Family found him and brought him to the ER.  He was found to have significant hematoma on his skull and CT findings of possible head bleed. He reported occasional dizziness during his exercises in the past and apparently he was told that it was normal..  He has had palpitations during exercise which he also thought was normal.  Patient uses some herbal supplements which are plant-based but no steroids.  Denies using any drugs.  Patient has had previous COVID-19 infection about 2 months ago and so is no longer infectious.     Assessment/Plan:   Principal Problem:   Syncope and collapse Active Problems:   Fall   Scalp hematoma   Hypokalemia  Syncope: Etiology unclear.  2D echo is pending.  Right parietal scalp hematoma/small focus of high attenuation in the right parietal lobe suspected to be due to hemorrhagic contusion: Repeat CT head showed improving scalp hematoma.  Headache, dizziness and slow gait: This is likely due to head injury.  Analgesics as needed for pain and supportive care.  Hypokalemia: Replete potassium   Body mass index is 24.37 kg/m.   Family Communication/Anticipated D/C date and plan/Code Status   DVT prophylaxis: SCDs Code Status: Full code Family Communication: Plan discussed with patient  Disposition Plan: Patient is from home.  Plan to discharge patient home tomorrow.  2D echo is pending.  He can be discharged when headache, dizziness and gait are better.    Subjective:    C/o headache (8/10), dizziness and gait is "slow". He calls for assistance to use the bathroom  Objective:    Vitals:   08/13/19 0600 08/13/19 0637 08/13/19 0800 08/13/19 1500  BP: (!) 130/93 125/68 121/78 122/81  Pulse: 75 70 65 76  Resp: 20 20 16 18   Temp:  99.1 F (37.3 C) 99.3 F (37.4 C) 99 F (37.2 C)  TempSrc:   Oral Oral  SpO2: 98% 99% 97% 100%  Weight:      Height:        Intake/Output Summary (Last 24 hours) at 08/13/2019 1735 Last data filed at 08/13/2019 1700 Gross per 24 hour  Intake 240 ml  Output 2 ml  Net 238 ml   Filed Weights   08/12/19 1619  Weight: 88.5 kg    Exam:  GEN: NAD SKIN: No rash. Bruising on right parietal scalp  EYES: EOMI ENT: MMM CV: RRR PULM: CTA B ABD: soft, ND, NT, +BS CNS: AAO x 3, non focal EXT: No edema or tenderness   Data Reviewed:   I have personally reviewed following labs and imaging studies:  Labs: Labs show the following:   Basic Metabolic Panel: Recent Labs  Lab 08/12/19 1641 08/13/19 0250 08/13/19 1017  NA 138 138  --   K 3.7 3.4*  --   CL 102 103  --   CO2 22 25  --   GLUCOSE 122* 136*  --   BUN 16 9  --  CREATININE 0.84 0.83  --   CALCIUM 9.6 9.4  --   MG  --   --  2.0  PHOS  --   --  3.6   GFR Estimated Creatinine Clearance: 166.9 mL/min (by C-G formula based on SCr of 0.83 mg/dL). Liver Function Tests: Recent Labs  Lab 08/12/19 1641 08/13/19 0250  AST 21 18  ALT 21 18  ALKPHOS 43 44  BILITOT 0.8 1.2  PROT 6.6 6.5  ALBUMIN 4.5 4.1   No results for input(s): LIPASE, AMYLASE in the last 168 hours. No results for input(s): AMMONIA in the last 168 hours. Coagulation profile No results for input(s): INR, PROTIME in the last 168 hours.  CBC: Recent Labs  Lab 08/12/19 1641 08/13/19 0250  WBC 9.0 9.8  NEUTROABS 6.8  --   HGB 16.2 15.0  HCT 46.2 43.3  MCV 92.6 92.7  PLT 197 194   Cardiac Enzymes: No results for input(s): CKTOTAL, CKMB, CKMBINDEX, TROPONINI in the last 168  hours. BNP (last 3 results) No results for input(s): PROBNP in the last 8760 hours. CBG: Recent Labs  Lab 08/12/19 1704  GLUCAP 118*   D-Dimer: No results for input(s): DDIMER in the last 72 hours. Hgb A1c: No results for input(s): HGBA1C in the last 72 hours. Lipid Profile: No results for input(s): CHOL, HDL, LDLCALC, TRIG, CHOLHDL, LDLDIRECT in the last 72 hours. Thyroid function studies: No results for input(s): TSH, T4TOTAL, T3FREE, THYROIDAB in the last 72 hours.  Invalid input(s): FREET3 Anemia work up: No results for input(s): VITAMINB12, FOLATE, FERRITIN, TIBC, IRON, RETICCTPCT in the last 72 hours. Sepsis Labs: Recent Labs  Lab 08/12/19 1641 08/13/19 0250  WBC 9.0 9.8    Microbiology Recent Results (from the past 240 hour(s))  SARS CORONAVIRUS 2 (TAT 6-24 HRS) Nasopharyngeal Nasopharyngeal Swab     Status: None   Collection Time: 08/13/19  6:08 AM   Specimen: Nasopharyngeal Swab  Result Value Ref Range Status   SARS Coronavirus 2 NEGATIVE NEGATIVE Final    Comment: (NOTE) SARS-CoV-2 target nucleic acids are NOT DETECTED. The SARS-CoV-2 RNA is generally detectable in upper and lower respiratory specimens during the acute phase of infection. Negative results do not preclude SARS-CoV-2 infection, do not rule out co-infections with other pathogens, and should not be used as the sole basis for treatment or other patient management decisions. Negative results must be combined with clinical observations, patient history, and epidemiological information. The expected result is Negative. Fact Sheet for Patients: HairSlick.no Fact Sheet for Healthcare Providers: quierodirigir.com This test is not yet approved or cleared by the [place] FDA and  has been authorized for detection and/or diagnosis of SARS-CoV-2 by FDA under an Emergency Use Authorization (EUA). This EUA will remain  in effect (meaning this test  can be used) for the duration of the COVID-19 declaration under Section 56 4(b)(1) of the Act, 21 U.S.C. section 360bbb-3(b)(1), unless the authorization is terminated or revoked sooner. Performed at [facility], [address], [place], [place] [address]     Procedures and diagnostic studies:  DG Chest 2 View  Result Date: 08/12/2019 CLINICAL DATA:  Syncope, unwitnessed fall. EXAM: CHEST - 2 VIEW COMPARISON:  None. FINDINGS: The heart size and mediastinal contours are within normal limits. Both lungs are clear. No pneumothorax or pleural effusion is noted. The visualized skeletal structures are unremarkable. IMPRESSION: No active cardiopulmonary disease. Electronically Signed   By: Lupita Raider M.D.   On: 08/12/2019 17:09  CT HEAD WO CONTRAST  Result Date: 08/13/2019 CLINICAL DATA:  24 year old male with history of subarachnoid hemorrhage. Follow-up study. EXAM: CT HEAD WITHOUT CONTRAST TECHNIQUE: Contiguous axial images were obtained from the base of the skull through the vertex without intravenous contrast. COMPARISON:  Head CT 08/12/2019. FINDINGS: Brain: Again noted is a small focus of high attenuation in the right parietal lobe (axial image 25 of series 2). No associated mass effect or surrounding edema. No evidence of acute infarction, hydrocephalus, extra-axial collection or mass lesion/mass effect. Vascular: No hyperdense vessel or unexpected calcification. Skull: Normal. Negative for fracture or focal lesion. Sinuses/Orbits: No acute finding. Other: High attenuation scalp thickening in the right parietal region, likely reflective of a resolving scalp hematoma. IMPRESSION: 1. Small focus of high attenuation in the right parietal lobe is unchanged, and again likely reflective of a tiny focus of subarachnoid hemorrhage or small hemorrhagic contusion. 2. Resolving right parietal scalp hematoma. Electronically Signed   By: Trudie Reed M.D.   On: 08/13/2019 09:06   CT HEAD WO  CONTRAST  Result Date: 08/12/2019 CLINICAL DATA:  Syncope EXAM: CT HEAD WITHOUT CONTRAST CT CERVICAL SPINE WITHOUT CONTRAST TECHNIQUE: Multidetector CT imaging of the head and cervical spine was performed following the standard protocol without intravenous contrast. Multiplanar CT image reconstructions of the cervical spine were also generated. COMPARISON:  None. FINDINGS: CT HEAD FINDINGS Brain: No acute territorial infarction. Small 3 mm hyperdense focus at the right parietal lobe without surrounding mass effect or significant edema. The ventricles are nonenlarged. Vascular: No hyperdense vessels.  No unexpected calcification Skull: None Sinuses/Orbits: Moderate right parietal scalp hematoma. Other: None CT CERVICAL SPINE FINDINGS Alignment: Straightening of the cervical spine. No subluxation. Facet alignment is normal Skull base and vertebrae: No acute fracture. No primary bone lesion or focal pathologic process. Soft tissues and spinal canal: No prevertebral fluid or swelling. No visible canal hematoma. Disc levels:  Within normal limits Upper chest: Negative. Other: None IMPRESSION: 1. 3 mm hyperdense focus at the right parietal lobe. Given overlying right parietal scalp hematoma, favor punctate focus of subarachnoid blood versus small contusion. Hyperdense nodule felt less likely in the absence of any known clinical history of malignancy. 2. Straightening of the cervical spine. No acute osseous abnormality. 3. Critical Value/emergent results were called by telephone at the time of interpretation on 08/12/2019 at 6:12 pm to provider [facility] , who verbally acknowledged these results. Electronically Signed   By: Jasmine Pang M.D.   On: 08/12/2019 18:13   CT CERVICAL SPINE WO CONTRAST  Result Date: 08/12/2019 CLINICAL DATA:  Syncope EXAM: CT HEAD WITHOUT CONTRAST CT CERVICAL SPINE WITHOUT CONTRAST TECHNIQUE: Multidetector CT imaging of the head and cervical spine was performed following the standard protocol  without intravenous contrast. Multiplanar CT image reconstructions of the cervical spine were also generated. COMPARISON:  None. FINDINGS: CT HEAD FINDINGS Brain: No acute territorial infarction. Small 3 mm hyperdense focus at the right parietal lobe without surrounding mass effect or significant edema. The ventricles are nonenlarged. Vascular: No hyperdense vessels.  No unexpected calcification Skull: None Sinuses/Orbits: Moderate right parietal scalp hematoma. Other: None CT CERVICAL SPINE FINDINGS Alignment: Straightening of the cervical spine. No subluxation. Facet alignment is normal Skull base and vertebrae: No acute fracture. No primary bone lesion or focal pathologic process. Soft tissues and spinal canal: No prevertebral fluid or swelling. No visible canal hematoma. Disc levels:  Within normal limits Upper chest: Negative. Other: None IMPRESSION: 1. 3 mm hyperdense focus at the  right parietal lobe. Given overlying right parietal scalp hematoma, favor punctate focus of subarachnoid blood versus small contusion. Hyperdense nodule felt less likely in the absence of any known clinical history of malignancy. 2. Straightening of the cervical spine. No acute osseous abnormality. 3. Critical Value/emergent results were called by telephone at the time of interpretation on 08/12/2019 at 6:12 pm to provider [facility] , who verbally acknowledged these results. Electronically Signed   By: Jasmine Pang M.D.   On: 08/12/2019 18:13    Medications:   . ondansetron (ZOFRAN) IV  4 mg Intravenous Once   Continuous Infusions: . sodium chloride Stopped (08/12/19 2038)  . sodium chloride 75 mL/hr at 08/12/19 2039     LOS: 1 day   Khila Papp  [facility]     08/13/2019, 5:35 PM

## 2019-08-13 NOTE — Progress Notes (Signed)
Pt arrival to floor at 06:37 am. Oriented pt to room environment. Placed tele monitor #02 vital signs taken. Per tele tech Pt currently SR 70. Updated day shift nurse Lexi will confirm Verification. Respirations even, unlabored no s/s of distress or pain at this time. Bed exit alarm placed for safety, pt admitted with a syncopal episode. Call light and phone placed in reach.

## 2019-08-13 NOTE — Progress Notes (Signed)
  Echocardiogram 2D Echocardiogram has been performed.  Arthur Flores 08/13/2019, 11:35 AM

## 2019-08-13 NOTE — Plan of Care (Signed)

## 2019-08-14 ENCOUNTER — Inpatient Hospital Stay (HOSPITAL_COMMUNITY): Payer: Managed Care, Other (non HMO)

## 2019-08-14 DIAGNOSIS — R55 Syncope and collapse: Secondary | ICD-10-CM

## 2019-08-14 DIAGNOSIS — S060X9A Concussion with loss of consciousness of unspecified duration, initial encounter: Secondary | ICD-10-CM

## 2019-08-14 DIAGNOSIS — S060XAA Concussion with loss of consciousness status unknown, initial encounter: Secondary | ICD-10-CM

## 2019-08-14 DIAGNOSIS — I629 Nontraumatic intracranial hemorrhage, unspecified: Secondary | ICD-10-CM

## 2019-08-14 NOTE — Procedures (Signed)
Patient Name: Arthur Flores  MRN: [id number]  Epilepsy Attending: Charlsie Quest  Referring Physician/Provider: Dr Calvert Cantor Date:08/14/2019  Duration: 25.17 mins  Patient history:  24 y.o. male without any significant past medical history who presented to the hospital with a syncopal episode that occurred while he was exercising. EEG to evaluate for seizure.   Level of alertness: awake  AEDs during EEG study: None  Technical aspects: This EEG study was done with scalp electrodes positioned according to the 10-20 International system of electrode placement. Electrical activity was acquired at a sampling rate of 500Hz  and reviewed with a high frequency filter of 70Hz  and a low frequency filter of 1Hz . EEG data were recorded continuously and digitally stored.   DESCRIPTION:  The posterior dominant rhythm consists of 8-9 Hz activity of moderate voltage (25-35 uV) seen predominantly in posterior head regions, symmetric and reactive to eye opening and eye closing. Sleep was characterized by vertex waves, sleep spindles (12-14hz ), maximal frontocentral. Physiologic photic driving was seen during photic stimulation. Hyperventilation was not performed.  IMPRESSION: This study is within normal limits. No seizures or epileptiform discharges were seen throughout the recording.  [name] 

## 2019-08-14 NOTE — Discharge Summary (Signed)
Physician Discharge Summary  Arthur Flores XLK:[id number] DOB: [birth year] DOA: 08/12/2019  PCP: Marline Backbone, MD  Admit date: 08/12/2019 Discharge date: 08/14/2019  Admitted From: home Disposition:  home   Recommendations for Outpatient Follow-up:  1. F/u with PCP in 5-7 days to determine if safe to return to regular activities  Home Health:  none  Discharge Condition:  stable   CODE STATUS:  Full code   Diet recommendation:  Heart healthy Consultations:  none  Procedures/Studies: . EEG, 2 D ECHO   Discharge Diagnoses:  Principal Problem:   Syncope and collapse Active Problems:   Brain concussion   Intracranial bleed (HCC)   Scalp hematoma   Hypokalemia    Brief Summary: Arthur Flores is a 24 y.o. 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  Patient has no recollection of what happened and how long has been down.  Family found him and brought him to the ER.  He was found to have significant hematoma on his skull and CT findings of possible head bleed.  Patient is currently back to baseline.  He reported occasional dizziness during his exercises in the past.  He is always told that was normal.  He has had palpitations during exercise which he also thought was normal.  Patient uses some herbal supplements which are plant-based but no steroids.  Denies using any drugs.  Patient has had previous COVID-19 infection about 2 months ago.  Hospital Course:  Syncope/ collapse- scalp hematoma, concussion with intracranial bleed - CT scan of the brain on admission and a repeat CT both reveal a right parietal lobe punctate hematoma which is not expanding - he currently feels dizzy but has no neurological deficits  - orthostatic vital signs were negative - UDS negative - he declines using steroids -  EKG and 2 D ECHO are unrevealing - EEG also unrevealing - at this point I have recommended that he not drive, operate heavy machinery or work out for at least 1 wk- he needs a f/u visit with his PCP in 5-7 days to determine if he is safe to return to these activities   Discharge Exam: Vitals:   08/14/19 0837 08/14/19 1419  BP: 128/74 131/68  Pulse: 73 69  Resp: 16 16  Temp:  98.1 F (36.7 C)  SpO2: 95% 99%   Vitals:   08/14/19 0832 08/14/19 0834 08/14/19 0837 08/14/19 1419  BP: 118/77 118/90 128/74 131/68  Pulse: (!) 52 (!) 56 73 69  Resp: 16 16 16 16   Temp: 98.4 F (36.9 C)   98.1 F (36.7 C)  TempSrc: Oral   Oral  SpO2: 99% 99% 95% 99%  Weight:      Height:        General: Pt is alert, awake, not in acute distress Cardiovascular: RRR, S1/S2 +, no rubs, no gallops Respiratory: CTA bilaterally, no wheezing, no rhonchi Abdominal: Soft, NT, ND, bowel sounds + Extremities: no edema, no cyanosis   Discharge Instructions  Discharge Instructions    Diet - low sodium heart healthy   Complete by: As directed    Increase activity slowly   Complete by: As directed      Allergies as of 08/14/2019      Reactions   Dairy Aid [lactase] Other (See Comments)   Upset stomach  Medication List    You have not been prescribed any medications.     Allergies  Allergen Reactions  . Dairy Aid [Lactase] Other (See Comments)    Upset stomach      DG Chest 2 View  Result Date: 08/12/2019 CLINICAL DATA:  Syncope, unwitnessed fall. EXAM: CHEST - 2 VIEW COMPARISON:  None. FINDINGS: The heart size and mediastinal contours are within normal limits. Both lungs are clear. No pneumothorax or pleural effusion is noted. The visualized skeletal structures are unremarkable. IMPRESSION: No active cardiopulmonary disease. Electronically Signed   By: Lupita Raider M.D.   On: 08/12/2019 17:09   CT HEAD WO CONTRAST  Result Date: 08/13/2019 CLINICAL DATA:  24 year old male with history of  subarachnoid hemorrhage. Follow-up study. EXAM: CT HEAD WITHOUT CONTRAST TECHNIQUE: Contiguous axial images were obtained from the base of the skull through the vertex without intravenous contrast. COMPARISON:  Head CT 08/12/2019. FINDINGS: Brain: Again noted is a small focus of high attenuation in the right parietal lobe (axial image 25 of series 2). No associated mass effect or surrounding edema. No evidence of acute infarction, hydrocephalus, extra-axial collection or mass lesion/mass effect. Vascular: No hyperdense vessel or unexpected calcification. Skull: Normal. Negative for fracture or focal lesion. Sinuses/Orbits: No acute finding. Other: High attenuation scalp thickening in the right parietal region, likely reflective of a resolving scalp hematoma. IMPRESSION: 1. Small focus of high attenuation in the right parietal lobe is unchanged, and again likely reflective of a tiny focus of subarachnoid hemorrhage or small hemorrhagic contusion. 2. Resolving right parietal scalp hematoma. Electronically Signed   By: Trudie Reed M.D.   On: 08/13/2019 09:06   CT HEAD WO CONTRAST  Result Date: 08/12/2019 CLINICAL DATA:  Syncope EXAM: CT HEAD WITHOUT CONTRAST CT CERVICAL SPINE WITHOUT CONTRAST TECHNIQUE: Multidetector CT imaging of the head and cervical spine was performed following the standard protocol without intravenous contrast. Multiplanar CT image reconstructions of the cervical spine were also generated. COMPARISON:  None. FINDINGS: CT HEAD FINDINGS Brain: No acute territorial infarction. Small 3 mm hyperdense focus at the right parietal lobe without surrounding mass effect or significant edema. The ventricles are nonenlarged. Vascular: No hyperdense vessels.  No unexpected calcification Skull: None Sinuses/Orbits: Moderate right parietal scalp hematoma. Other: None CT CERVICAL SPINE FINDINGS Alignment: Straightening of the cervical spine. No subluxation. Facet alignment is normal Skull base and  vertebrae: No acute fracture. No primary bone lesion or focal pathologic process. Soft tissues and spinal canal: No prevertebral fluid or swelling. No visible canal hematoma. Disc levels:  Within normal limits Upper chest: Negative. Other: None IMPRESSION: 1. 3 mm hyperdense focus at the right parietal lobe. Given overlying right parietal scalp hematoma, favor punctate focus of subarachnoid blood versus small contusion. Hyperdense nodule felt less likely in the absence of any known clinical history of malignancy. 2. Straightening of the cervical spine. No acute osseous abnormality. 3. Critical Value/emergent results were called by telephone at the time of interpretation on 08/12/2019 at 6:12 pm to provider [facility] , who verbally acknowledged these results. Electronically Signed   By: Jasmine Pang M.D.   On: 08/12/2019 18:13   CT CERVICAL SPINE WO CONTRAST  Result Date: 08/12/2019 CLINICAL DATA:  Syncope EXAM: CT HEAD WITHOUT CONTRAST CT CERVICAL SPINE WITHOUT CONTRAST TECHNIQUE: Multidetector CT imaging of the head and cervical spine was performed following the standard protocol without intravenous contrast. Multiplanar CT image reconstructions of the cervical spine were also generated. COMPARISON:  None. FINDINGS: CT  HEAD FINDINGS Brain: No acute territorial infarction. Small 3 mm hyperdense focus at the right parietal lobe without surrounding mass effect or significant edema. The ventricles are nonenlarged. Vascular: No hyperdense vessels.  No unexpected calcification Skull: None Sinuses/Orbits: Moderate right parietal scalp hematoma. Other: None CT CERVICAL SPINE FINDINGS Alignment: Straightening of the cervical spine. No subluxation. Facet alignment is normal Skull base and vertebrae: No acute fracture. No primary bone lesion or focal pathologic process. Soft tissues and spinal canal: No prevertebral fluid or swelling. No visible canal hematoma. Disc levels:  Within normal limits Upper chest: Negative.  Other: None IMPRESSION: 1. 3 mm hyperdense focus at the right parietal lobe. Given overlying right parietal scalp hematoma, favor punctate focus of subarachnoid blood versus small contusion. Hyperdense nodule felt less likely in the absence of any known clinical history of malignancy. 2. Straightening of the cervical spine. No acute osseous abnormality. 3. Critical Value/emergent results were called by telephone at the time of interpretation on 08/12/2019 at 6:12 pm to provider [facility] [place] , who verbally acknowledged these results. Electronically Signed   By: Jasmine Pang M.D.   On: 08/12/2019 18:13   EEG adult  Result Date: 08/14/2019 Charlsie Quest, MD     08/14/2019  4:36 PM Patient Name: Arthur Flores MRN: [id number] Epilepsy Attending: Charlsie Quest Referring Physician/Provider: Dr Calvert Cantor Date:08/14/2019 Duration: 25.17 mins Patient history:  24 y.o. male without any significant past medical history who presented to the hospital with a syncopal episode that occurred while he was exercising. EEG to evaluate for seizure. Level of alertness: awake AEDs during EEG study: None Technical aspects: This EEG study was done with scalp electrodes positioned according to the 10-20 International system of electrode placement. Electrical activity was acquired at a sampling rate of 500Hz  and reviewed with a high frequency filter of 70Hz  and a low frequency filter of 1Hz . EEG data were recorded continuously and digitally stored. DESCRIPTION:  The posterior dominant rhythm consists of 8-9 Hz activity of moderate voltage (25-35 uV) seen predominantly in posterior head regions, symmetric and reactive to eye opening and eye closing. Sleep was characterized by vertex waves, sleep spindles (12-14hz ), maximal frontocentral. Physiologic photic driving was seen during photic stimulation. Hyperventilation was not performed. IMPRESSION: This study is within normal limits. No seizures or epileptiform discharges were seen throughout  the recording. Charlsie Quest   ECHOCARDIOGRAM COMPLETE  Result Date: 08/13/2019    ECHOCARDIOGRAM REPORT   Patient Name:   Demarian ATAVION REDSTONE Date of Exam: 08/13/2019 Medical Rec #:  [id number]    Height:       75.0 in Accession #:    [id number]   Weight:       195.0 lb Date of Birth:  26-Jun-1995    BSA:          2.171 m Patient Age:    23 years     BP:           121/78 mmHg Patient Gender: M            HR:           80 bpm. Exam Location:  Inpatient Procedure: 2D Echo Indications:    Syncope R55  History:        Patient has no prior history of Echocardiogram examinations.  Sonographer:    Thurman Coyer RDCS (AE) Referring Phys: [id number] MOHAMMAD L GARBA IMPRESSIONS  1. Left ventricular ejection fraction, by estimation, is 60 to 65%. The left ventricle has  normal function. The left ventricle has no regional wall motion abnormalities. Left ventricular diastolic parameters were normal.  2. Right ventricular systolic function is normal. The right ventricular size is normal. Tricuspid regurgitation signal is inadequate for assessing PA pressure.  3. The mitral valve is normal in structure and function. No evidence of mitral valve regurgitation. No evidence of mitral stenosis.  4. The aortic valve is tricuspid. Aortic valve regurgitation is not visualized. No aortic stenosis is present.  5. The inferior vena cava is dilated in size with >50% respiratory variability, suggesting right atrial pressure of 8 mmHg. FINDINGS  Left Ventricle: Left ventricular ejection fraction, by estimation, is 60 to 65%. The left ventricle has normal function. The left ventricle has no regional wall motion abnormalities. The left ventricular internal cavity size was normal in size. There is  no left ventricular hypertrophy. Left ventricular diastolic parameters were normal. Right Ventricle: The right ventricular size is normal. No increase in right ventricular wall thickness. Right ventricular systolic function is normal. Tricuspid regurgitation  signal is inadequate for assessing PA pressure. Left Atrium: Left atrial size was normal in size. Right Atrium: Right atrial size was normal in size. Pericardium: There is no evidence of pericardial effusion. Mitral Valve: The mitral valve is normal in structure and function. No evidence of mitral valve regurgitation. No evidence of mitral valve stenosis. Tricuspid Valve: The tricuspid valve is normal in structure. Tricuspid valve regurgitation is not demonstrated. Aortic Valve: The aortic valve is tricuspid. Aortic valve regurgitation is not visualized. No aortic stenosis is present. Pulmonic Valve: The pulmonic valve was normal in structure. Pulmonic valve regurgitation is not visualized. Aorta: The aortic root is normal in size and structure. Venous: The inferior vena cava is dilated in size with greater than 50% respiratory variability, suggesting right atrial pressure of 8 mmHg. IAS/Shunts: No atrial level shunt detected by color flow Doppler.  LEFT VENTRICLE PLAX 2D LVIDd:         5.30 cm  Diastology LVIDs:         3.20 cm  LV e' lateral:   15.90 cm/s LV PW:         1.00 cm  LV E/e' lateral: 5.6 LV IVS:        0.90 cm  LV e' medial:    13.30 cm/s LVOT diam:     2.20 cm  LV E/e' medial:  6.7 LV SV:         75 LV SV Index:   34 LVOT Area:     3.80 cm  RIGHT VENTRICLE             IVC RV Basal diam:  3.40 cm     IVC diam: 2.20 cm RV Mid diam:    4.80 cm RV S prime:     16.60 cm/s TAPSE (M-mode): 2.9 cm LEFT ATRIUM             Index LA diam:        3.30 cm 1.52 cm/m LA Vol (A2C):   51.2 ml 23.58 ml/m LA Vol (A4C):   39.7 ml 18.29 ml/m LA Biplane Vol: 46.0 ml 21.19 ml/m  AORTIC VALVE LVOT Vmax:   101.00 cm/s LVOT Vmean:  62.900 cm/s LVOT VTI:    0.196 m  AORTA Ao Root diam: 3.40 cm Ao Asc diam:  2.90 cm MITRAL VALVE MV Area (PHT): 3.12 cm    SHUNTS MV Decel Time: 243 msec    Systemic VTI:  0.20 m MV E  velocity: 88.80 cm/s  Systemic Diam: 2.20 cm MV A velocity: 41.00 cm/s MV E/A ratio:  2.17 Marca Ancona MD  Electronically signed by Marca Ancona MD Signature Date/Time: 08/13/2019/5:39:54 PM    Final      The results of significant diagnostics from this hospitalization (including imaging, microbiology, ancillary and laboratory) are listed below for reference.     Microbiology: Recent Results (from the past 240 hour(s))  SARS CORONAVIRUS 2 (TAT 6-24 HRS) Nasopharyngeal Nasopharyngeal Swab     Status: None   Collection Time: 08/13/19  6:08 AM   Specimen: Nasopharyngeal Swab  Result Value Ref Range Status   SARS Coronavirus 2 NEGATIVE NEGATIVE Final    Comment: (NOTE) SARS-CoV-2 target nucleic acids are NOT DETECTED. The SARS-CoV-2 RNA is generally detectable in upper and lower respiratory specimens during the acute phase of infection. Negative results do not preclude SARS-CoV-2 infection, do not rule out co-infections with other pathogens, and should not be used as the sole basis for treatment or other patient management decisions. Negative results must be combined with clinical observations, patient history, and epidemiological information. The expected result is Negative. Fact Sheet for Patients: HairSlick.no Fact Sheet for Healthcare Providers: quierodirigir.com This test is not yet approved or cleared by the [place] FDA and  has been authorized for detection and/or diagnosis of SARS-CoV-2 by FDA under an Emergency Use Authorization (EUA). This EUA will remain  in effect (meaning this test can be used) for the duration of the COVID-19 declaration under Section 56 4(b)(1) of the Act, 21 U.S.C. section 360bbb-3(b)(1), unless the authorization is terminated or revoked sooner. Performed at [facility], [address] [place], [place] [address]      Labs: BNP (last 3 results) No results for input(s): BNP in the last 8760 hours. Basic Metabolic Panel: Recent Labs  Lab 08/12/19 1641 08/13/19 0250 08/13/19 1017  NA  138 138  --   K 3.7 3.4*  --   CL 102 103  --   CO2 22 25  --   GLUCOSE 122* 136*  --   BUN 16 9  --   CREATININE 0.84 0.83  --   CALCIUM 9.6 9.4  --   MG  --   --  2.0  PHOS  --   --  3.6   Liver Function Tests: Recent Labs  Lab 08/12/19 1641 08/13/19 0250  AST 21 18  ALT 21 18  ALKPHOS 43 44  BILITOT 0.8 1.2  PROT 6.6 6.5  ALBUMIN 4.5 4.1   No results for input(s): LIPASE, AMYLASE in the last 168 hours. No results for input(s): AMMONIA in the last 168 hours. CBC: Recent Labs  Lab 08/12/19 1641 08/13/19 0250  WBC 9.0 9.8  NEUTROABS 6.8  --   HGB 16.2 15.0  HCT 46.2 43.3  MCV 92.6 92.7  PLT 197 194   Cardiac Enzymes: No results for input(s): CKTOTAL, CKMB, CKMBINDEX, TROPONINI in the last 168 hours. BNP: Invalid input(s): POCBNP CBG: Recent Labs  Lab 08/12/19 1704  GLUCAP 118*   D-Dimer No results for input(s): DDIMER in the last 72 hours. Hgb A1c No results for input(s): HGBA1C in the last 72 hours. Lipid Profile No results for input(s): CHOL, HDL, LDLCALC, TRIG, CHOLHDL, LDLDIRECT in the last 72 hours. Thyroid function studies No results for input(s): TSH, T4TOTAL, T3FREE, THYROIDAB in the last 72 hours.  Invalid input(s): FREET3 Anemia work up No results for input(s): VITAMINB12, FOLATE, FERRITIN, TIBC, IRON, RETICCTPCT in the  last 72 hours. Urinalysis    Component Value Date/Time   COLORURINE YELLOW 08/12/2019 2035   APPEARANCEUR CLEAR 08/12/2019 2035   LABSPEC 1.012 08/12/2019 2035   PHURINE 7.0 08/12/2019 2035   GLUCOSEU NEGATIVE 08/12/2019 2035   HGBUR NEGATIVE 08/12/2019 2035   BILIRUBINUR NEGATIVE 08/12/2019 2035   KETONESUR 5 (A) 08/12/2019 2035   PROTEINUR NEGATIVE 08/12/2019 2035   NITRITE NEGATIVE 08/12/2019 2035   LEUKOCYTESUR NEGATIVE 08/12/2019 2035   Sepsis Labs Invalid input(s): PROCALCITONIN,  WBC,  LACTICIDVEN Microbiology Recent Results (from the past 240 hour(s))  SARS CORONAVIRUS 2 (TAT 6-24 HRS) Nasopharyngeal  Nasopharyngeal Swab     Status: None   Collection Time: 08/13/19  6:08 AM   Specimen: Nasopharyngeal Swab  Result Value Ref Range Status   SARS Coronavirus 2 NEGATIVE NEGATIVE Final    Comment: (NOTE) SARS-CoV-2 target nucleic acids are NOT DETECTED. The SARS-CoV-2 RNA is generally detectable in upper and lower respiratory specimens during the acute phase of infection. Negative results do not preclude SARS-CoV-2 infection, do not rule out co-infections with other pathogens, and should not be used as the sole basis for treatment or other patient management decisions. Negative results must be combined with clinical observations, patient history, and epidemiological information. The expected result is Negative. Fact Sheet for Patients: SugarRoll.be Fact Sheet for Healthcare Providers: https://www.woods-mathews.com/ This test is not yet approved or cleared by the [place] FDA and  has been authorized for detection and/or diagnosis of SARS-CoV-2 by FDA under an Emergency Use Authorization (EUA). This EUA will remain  in effect (meaning this test can be used) for the duration of the COVID-19 declaration under Section 56 4(b)(1) of the Act, 21 U.S.C. section 360bbb-3(b)(1), unless the authorization is terminated or revoked sooner. Performed at [facility], [place] [address], [place], [place] [address]      Time coordinating discharge in minutes: 60  SIGNED:   Debbe Odea, MD  [facility] 08/14/2019, 5:43 PM

## 2019-08-14 NOTE — Progress Notes (Signed)
EEG complete - results pending 

## 2019-08-14 NOTE — Progress Notes (Signed)
Patient discharged to home with dad.  All personal belongings gathered and taken with him.  Discharge instructions gone over and no further questions from patient or family.  Work note printed and given to patient.

## 2021-06-25 IMAGING — CT CT HEAD W/O CM
4 series · 16 of 47 positions shown, 18 images · non-contrast
Comparison: Head CT 08/12/2019.

CLINICAL DATA: 23-year-old male with history of subarachnoid
hemorrhage. Follow-up study.

EXAM:
CT HEAD WITHOUT CONTRAST
TECHNIQUE: Contiguous axial images were obtained from the base of the skull
through the vertex without intravenous contrast.

[Series 2: head without · axial · non-contrast · 0.42mm/px · z∈[-104,+21]mm · 7 of 35 slices shown, 9 images]
[im 5/35  brain]
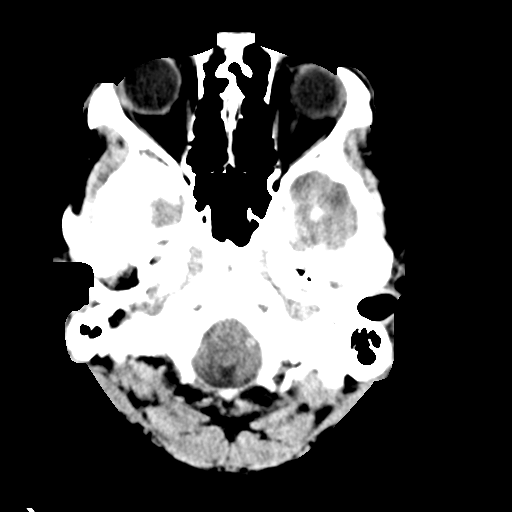
[im 5/35  bone]
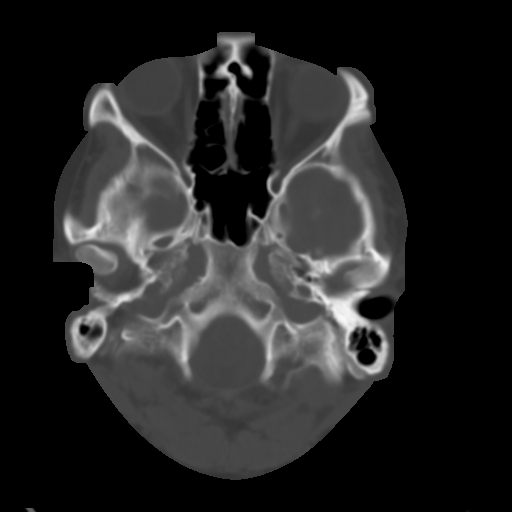
[im 9/35  brain]
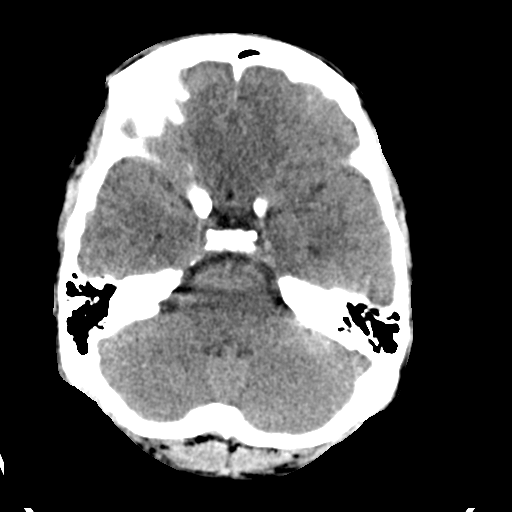
[im 13/35  brain]
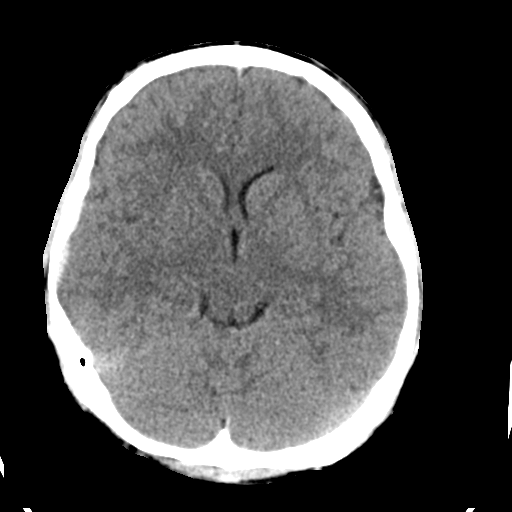
[im 18/35  brain]
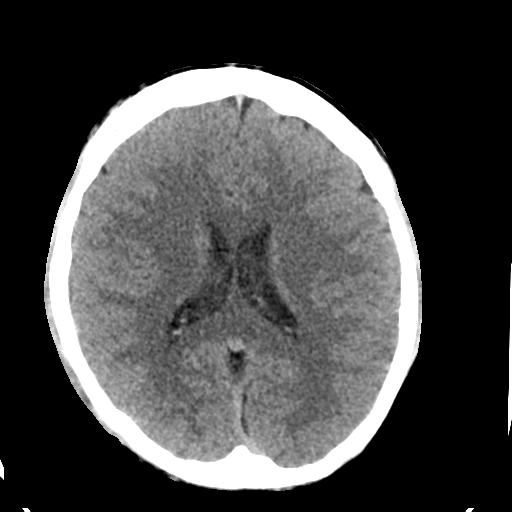
[im 22/35  brain]
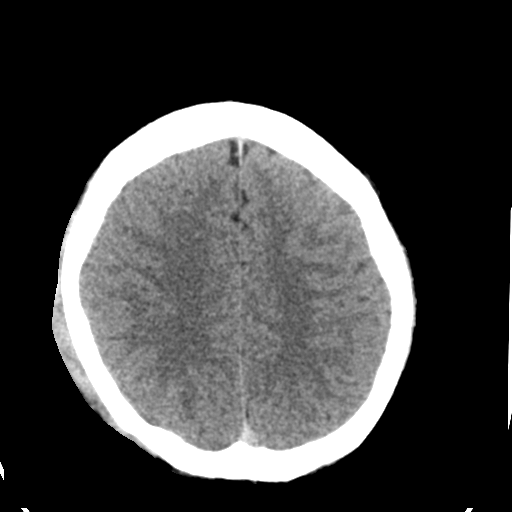
[im 22/35  bone]
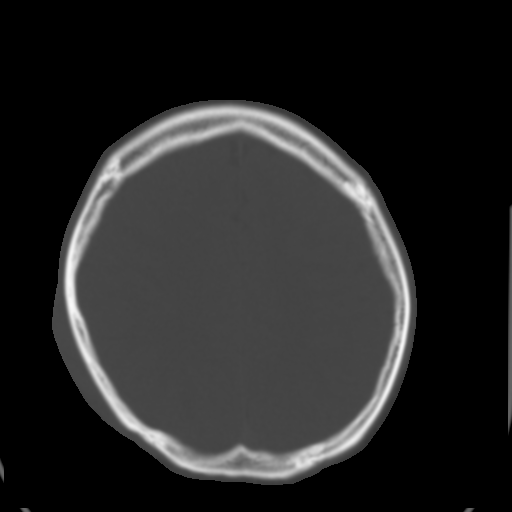
[im 26/35  brain]
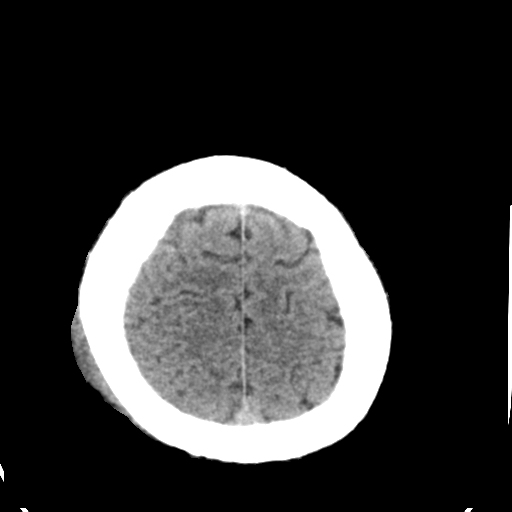
[im 30/35  brain]
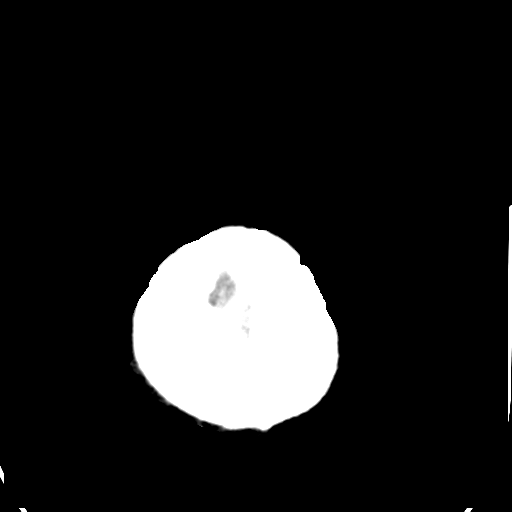

[Series 3: head bone · axial · 0.42mm/px · z∈[-108,-74]mm · 3 of 87 slices shown]
[im 9/87  bone]
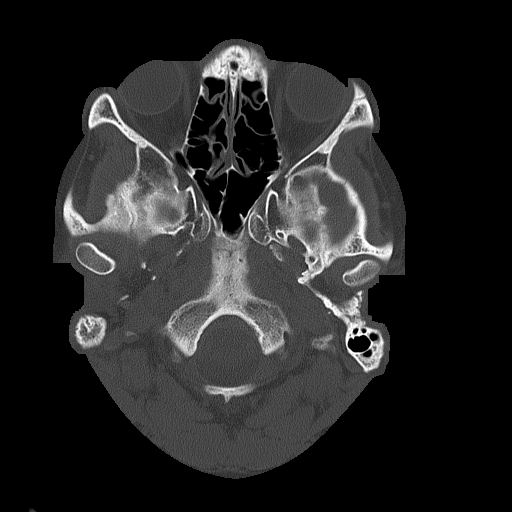
[im 18/87  bone]
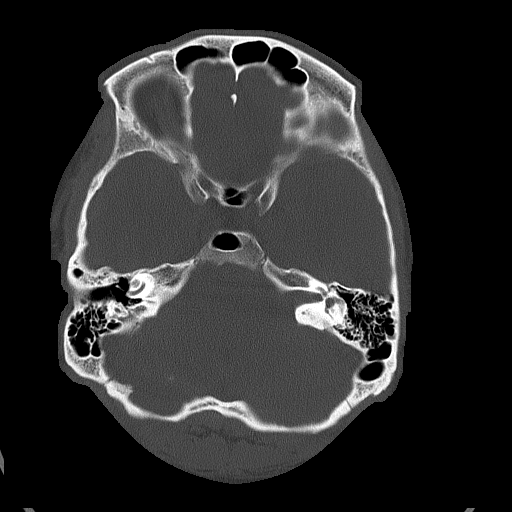
[im 26/87  bone]
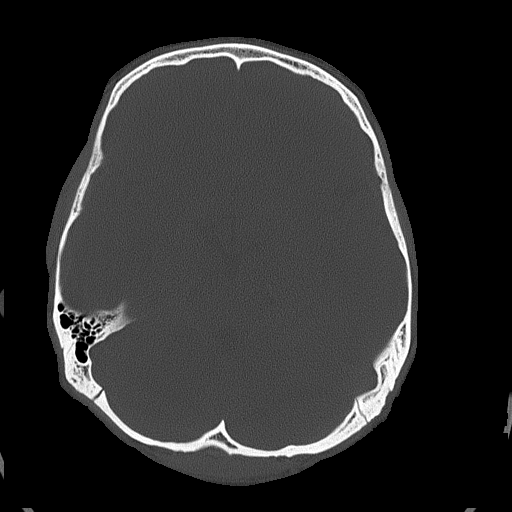

[Series 4: head without cor · coronal · non-contrast · 0.37mm/px · 3 of 67 slices shown]
[im 23/67  brain]
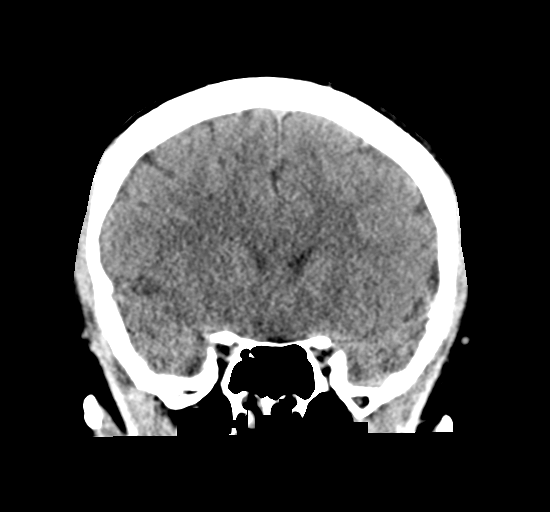
[im 30/67  brain]
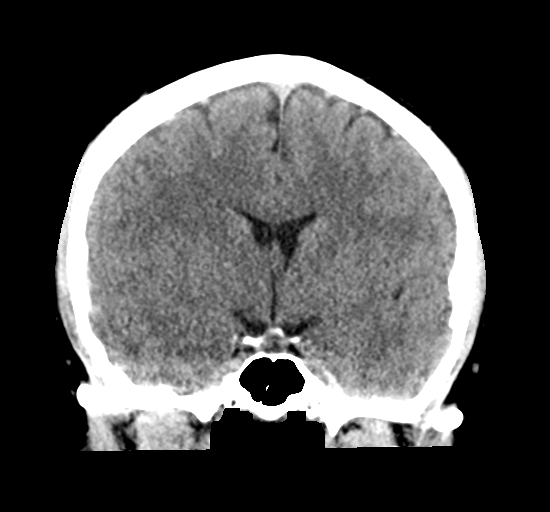
[im 37/67  brain]
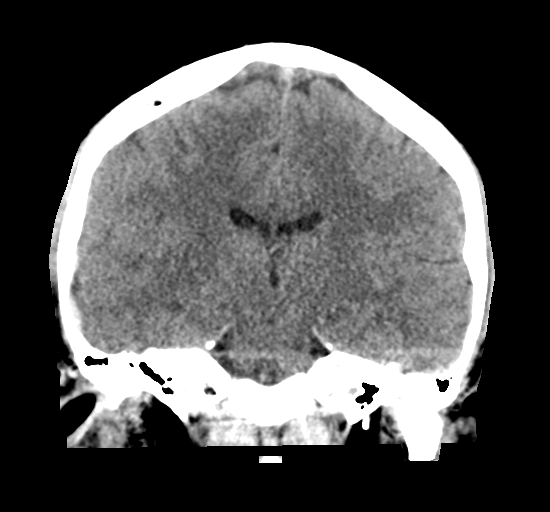

[Series 5: head without sag · sagittal · non-contrast · 0.35mm/px · 3 of 67 slices shown]
[im 23/67  brain]
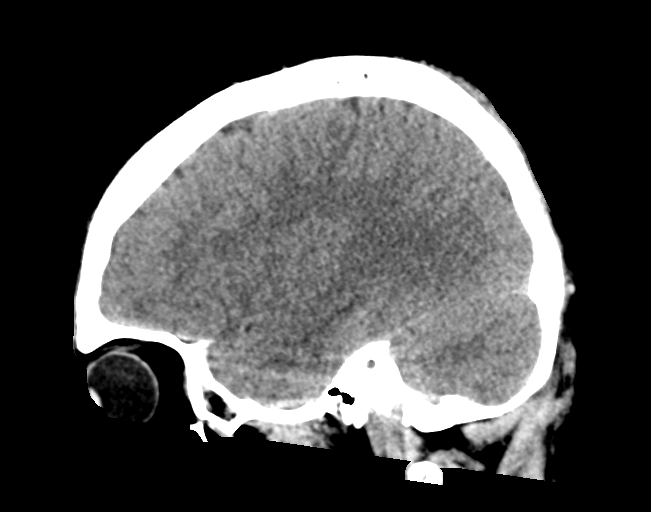
[im 34/67  brain]
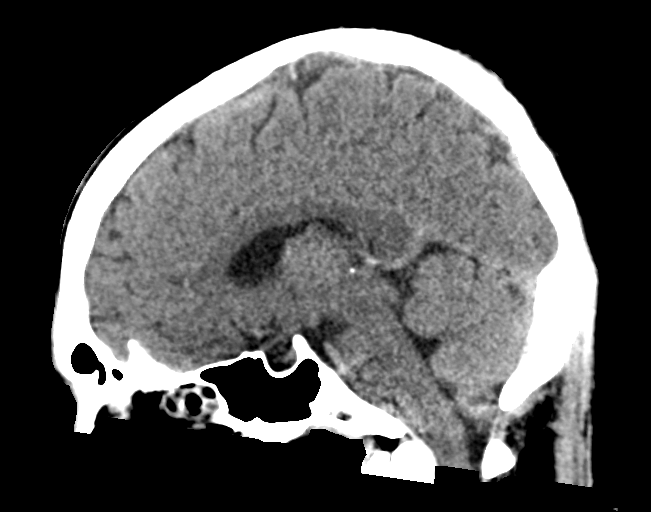
[im 45/67  brain]
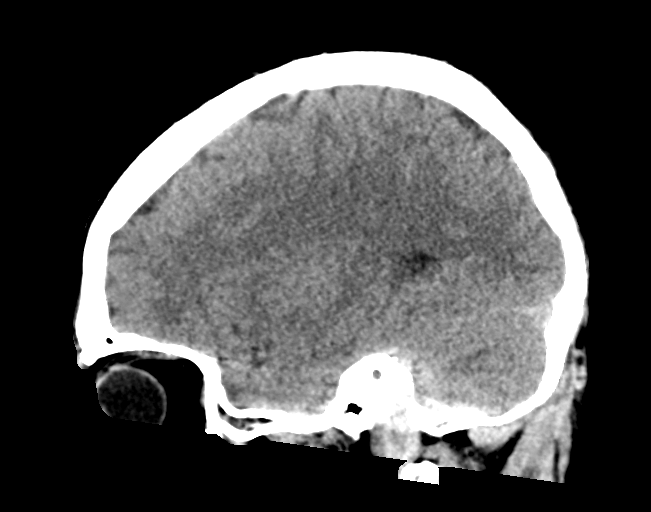

[16 of 47 positions shown; findings below may reference images not displayed]

FINDINGS: Brain: Again noted is a small focus of high attenuation in the right
parietal lobe (axial image 25 of series 2). No associated mass
effect or surrounding edema. No evidence of acute infarction,
hydrocephalus, extra-axial collection or mass lesion/mass effect.

Vascular: No hyperdense vessel or unexpected calcification.

Skull: Normal. Negative for fracture or focal lesion.

Sinuses/Orbits: No acute finding.

Other: High attenuation scalp thickening in the right parietal
region, likely reflective of a resolving scalp hematoma.
IMPRESSION: 1. Small focus of high attenuation in the right parietal lobe is
unchanged, and again likely reflective of a tiny focus of
subarachnoid hemorrhage or small hemorrhagic contusion.
2. Resolving right parietal scalp hematoma.
